# Patient Record
Sex: Male | Born: 1975 | Race: White | Hispanic: No | Marital: Single | Smoking: Never smoker
Health system: Southern US, Community
[De-identification: ages and names within clinical notes are randomized; demographics above are authoritative.]

## PROBLEM LIST (undated history)

## (undated) HISTORY — PX: HERNIA REPAIR: SHX51

---

## 2019-03-27 ENCOUNTER — Other Ambulatory Visit: Payer: Self-pay

## 2019-03-27 ENCOUNTER — Ambulatory Visit
Admission: EM | Admit: 2019-03-27 | Discharge: 2019-03-27 | Disposition: A | Discharge status: Home / Self Care | Payer: 59 | Attending: Family Medicine | Admitting: Family Medicine

## 2019-03-27 ENCOUNTER — Encounter: Payer: Self-pay | Admitting: Emergency Medicine

## 2019-03-27 DIAGNOSIS — R1030 Lower abdominal pain, unspecified: Secondary | ICD-10-CM | POA: Diagnosis not present

## 2019-03-27 DIAGNOSIS — K921 Melena: Secondary | ICD-10-CM | POA: Insufficient documentation

## 2019-03-27 DIAGNOSIS — R7989 Other specified abnormal findings of blood chemistry: Secondary | ICD-10-CM | POA: Insufficient documentation

## 2019-03-27 LAB — CBC WITH DIFFERENTIAL/PLATELET
Abs Immature Granulocytes: 0.03 10*3/uL (ref 0.00–0.07)
Basophils Absolute: 0.1 10*3/uL (ref 0.0–0.1)
Basophils Relative: 1 %
Eosinophils Absolute: 0.4 10*3/uL (ref 0.0–0.5)
Eosinophils Relative: 6 %
HCT: 45.5 % (ref 39.0–52.0)
Hemoglobin: 15.6 g/dL (ref 13.0–17.0)
Immature Granulocytes: 1 %
Lymphocytes Relative: 23 %
Lymphs Abs: 1.5 10*3/uL (ref 0.7–4.0)
MCH: 30.8 pg (ref 26.0–34.0)
MCHC: 34.3 g/dL (ref 30.0–36.0)
MCV: 89.7 fL (ref 80.0–100.0)
Monocytes Absolute: 0.5 10*3/uL (ref 0.1–1.0)
Monocytes Relative: 8 %
Neutro Abs: 4.1 10*3/uL (ref 1.7–7.7)
Neutrophils Relative %: 61 %
Platelets: 219 10*3/uL (ref 150–400)
RBC: 5.07 MIL/uL (ref 4.22–5.81)
RDW: 12.6 % (ref 11.5–15.5)
WBC: 6.6 10*3/uL (ref 4.0–10.5)
nRBC: 0 % (ref 0.0–0.2)

## 2019-03-27 LAB — COMPREHENSIVE METABOLIC PANEL
ALT: 161 U/L — ABNORMAL HIGH (ref 0–44)
AST: 103 U/L — ABNORMAL HIGH (ref 15–41)
Albumin: 4.6 g/dL (ref 3.5–5.0)
Alkaline Phosphatase: 60 U/L (ref 38–126)
Anion gap: 8 (ref 5–15)
BUN: 19 mg/dL (ref 6–20)
CO2: 27 mmol/L (ref 22–32)
Calcium: 8.7 mg/dL — ABNORMAL LOW (ref 8.9–10.3)
Chloride: 101 mmol/L (ref 98–111)
Creatinine, Ser: 1.13 mg/dL (ref 0.61–1.24)
GFR calc Af Amer: >60 mL/min (ref >60)
GFR calc non Af Amer: >60 mL/min (ref >60)
Glucose, Bld: 95 mg/dL (ref 70–99)
Potassium: 4.8 mmol/L (ref 3.5–5.1)
Sodium: 136 mmol/L (ref 135–145)
Total Bilirubin: 1.1 mg/dL (ref 0.3–1.2)
Total Protein: 7.7 g/dL (ref 6.5–8.1)

## 2019-03-27 LAB — LIPASE, BLOOD: Lipase: 39 U/L (ref 11–51)

## 2019-03-27 NOTE — Discharge Instructions (Signed)
No alcohol.  I am referring you to GI for evaluation and likely colonoscopy.  Take care  Dr. Lacinda Axon

## 2019-03-27 NOTE — ED Triage Notes (Signed)
Pt c/o bloody stools and lower abdominal pain. He states that the bloody stools have been going on for about a year but has gotten worse. He states it is dark red in color. He states the abdominal pain started about a month ago.

## 2019-03-27 NOTE — ED Provider Notes (Signed)
[facility]    CSN: [id number] Arrival date & time: 03/27/19  0813  History   Chief Complaint Chief Complaint  Patient presents with  . Abdominal Pain  . Rectal Bleeding   HPI  43 year old male presents with the above complaints.  Patient reports that over the past year he has had frequent/daily bloody bowel movements.  Patient reports that for the past month he has had associated lower abdominal pain which he describes as cramping.  He rates his pain as 5/10 in severity.  Denies melena.  Denies constipation.  Patient does state that he has a lot of gas.  Denies weight loss, fevers, chills.  He is a non-smoker.  He drinks alcohol on the weekends.  No family history of inflammatory bowel disease per his report.  No known exacerbating relieving factors.  No other reported symptoms.  No other complaints.  PMH, Surgical Hx, Family Hx, Social History reviewed and updated as below.  No significant PMH.  Past Surgical History:  Procedure Laterality Date  . HERNIA REPAIR     Home Medications    Prior to Admission medications   Not on File    Family History Family History  Problem Relation Age of Onset  . Healthy Mother   . Healthy Father   . Cancer Maternal Uncle        colon    Social History Social History   Tobacco Use  . Smoking status: Never Smoker  . Smokeless tobacco: Never Used  Substance Use Topics  . Alcohol use: Yes  . Drug use: Not Currently    Allergies   Patient has no known allergies.   Review of Systems Review of Systems  Constitutional: Negative for appetite change, chills, fever and unexpected weight change.  Gastrointestinal: Positive for abdominal pain and blood in stool.  All other systems reviewed and are negative.  Physical Exam Triage Vital Signs ED Triage Vitals  Enc Vitals Group     BP 03/27/19 0839 130/80     Pulse Rate 03/27/19 0839 69     Resp 03/27/19 0839 18     Temp 03/27/19 0839 98.6 F (37 C)     Temp  Source 03/27/19 0839 Oral     SpO2 03/27/19 0839 98 %     Weight 03/27/19 0835 205 lb (93 kg)     Height 03/27/19 0835 6' (1.829 m)     Head Circumference --      Peak Flow --      Pain Score 03/27/19 0835 5     Pain Loc --      Pain Edu? --      Excl. in [place]? --    Updated Vital Signs BP 130/80 (BP Location: Right Arm)   Pulse 69   Temp 98.6 F (37 C) (Oral)   Resp 18   Ht 6' (1.829 m)   Wt 93 kg   SpO2 98%   BMI 27.80 kg/m   Visual Acuity Right Eye Distance:   Left Eye Distance:   Bilateral Distance:    Right Eye Near:   Left Eye Near:    Bilateral Near:     Physical Exam Vitals and nursing note reviewed.  Constitutional:      General: He is not in acute distress.    Appearance: Normal appearance. He is not ill-appearing.  HENT:     Head: Normocephalic and atraumatic.  Eyes:     General:        Right eye:  No discharge.        Left eye: No discharge.     Conjunctiva/sclera: Conjunctivae normal.  Cardiovascular:     Rate and Rhythm: Normal rate and regular rhythm.     Heart sounds: No murmur.  Pulmonary:     Effort: Pulmonary effort is normal.     Breath sounds: No wheezing, rhonchi or rales.  Abdominal:     General: There is no distension.     Palpations: Abdomen is soft.     Tenderness: There is no abdominal tenderness. There is no guarding or rebound.  Skin:    General: Skin is warm.     Findings: No rash.  Neurological:     Mental Status: He is alert.  Psychiatric:        Mood and Affect: Mood normal.        Behavior: Behavior normal.    UC Treatments / Results  Labs (all labs ordered are listed, but only abnormal results are displayed) Labs Reviewed  COMPREHENSIVE METABOLIC PANEL - Abnormal; Notable for the following components:      Result Value   Calcium 8.7 (*)    AST 103 (*)    ALT 161 (*)    All other components within normal limits  CBC WITH DIFFERENTIAL/PLATELET  LIPASE, BLOOD    EKG   Radiology No results  found.  Procedures Procedures (including critical care time)  Medications Ordered in UC Medications - No data to display  Initial Impression / Assessment and Plan / UC Course  I have reviewed the triage vital signs and the nursing notes.  Pertinent labs & imaging results that were available during my care of the patient were reviewed by me and considered in my medical decision making (see chart for details).    43 year old male presents with a 1 year history of regular/frequent hematochezia. Now having abdominal pain.  CBC normal.  CMP revealed elevated AST and ALT of 103 and 161 respectively.  Labs otherwise unremarkable.  Concern for inflammatory bowel disease.  Referring to GI for further evaluation and colonoscopy.  Final Clinical Impressions(s) / UC Diagnoses   Final diagnoses:  Hematochezia  Lower abdominal pain  Elevated LFTs     Discharge Instructions     No alcohol.  I am referring you to GI for evaluation and likely colonoscopy.  Take care  Dr. Adriana Simas     ED Prescriptions    None     PDMP not reviewed this encounter.   Everlene Other [place], [place] 03/27/19 [phone]

## 2019-05-13 ENCOUNTER — Ambulatory Visit: Payer: 59 | Admitting: Gastroenterology

## 2019-06-23 ENCOUNTER — Ambulatory Visit: Admission: EM | Admit: 2019-06-23 | Discharge: 2019-06-23 | Discharge status: Against Medical Advice | Payer: 59

## 2019-06-23 ENCOUNTER — Other Ambulatory Visit: Payer: Self-pay

## 2019-10-14 ENCOUNTER — Encounter: Payer: Self-pay | Admitting: Emergency Medicine

## 2019-10-14 ENCOUNTER — Ambulatory Visit
Admission: EM | Admit: 2019-10-14 | Discharge: 2019-10-14 | Disposition: A | Discharge status: Home / Self Care | Payer: 59 | Attending: Family Medicine | Admitting: Family Medicine

## 2019-10-14 ENCOUNTER — Other Ambulatory Visit: Payer: Self-pay

## 2019-10-14 DIAGNOSIS — M79642 Pain in left hand: Secondary | ICD-10-CM

## 2019-10-14 MED ORDER — MELOXICAM 15 MG PO TABS: 15.0000 mg | ORAL_TABLET | Freq: Every day | ORAL | 0 refills | Status: AC | PRN

## 2019-10-14 NOTE — ED Triage Notes (Signed)
Pt c/o left hand pain. Started about 3 weeks ago. No known injury. He states it is getting harder to grip objects and the middle finger joint seems to be swollen. He states he has taken ibuprofen and helps.

## 2019-10-14 NOTE — ED Provider Notes (Signed)
[facility]    CSN: [id number] Arrival date & time: 10/14/19  1258      History   Chief Complaint Chief Complaint  Patient presents with   Hand Pain    left   HPI  44 year old male presents with left hand pain.  3-week history of left hand pain.  Pain is predominantly around the PIP joint and MCP joint of the left middle finger.  No fall, trauma, injury.  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  The area appears to be swollen.  He has been taking ibuprofen with some improvement.  However, the problem has not resolved.  No other associated symptoms.  No other complaints.  Home Medications    Prior to Admission medications   Medication Sig Start Date End Date Taking? Authorizing Provider  meloxicam (MOBIC) 15 MG tablet Take 1 tablet (15 mg total) by mouth daily as needed for pain. 10/14/19   Tommie Sams, DO    Family History Family History  Problem Relation Age of Onset   Healthy Mother    Healthy Father    Cancer Maternal Uncle        colon   Social History Social History   Tobacco Use   Smoking status: Never Smoker   Smokeless tobacco: Current User    Types: Snuff  Vaping Use   Vaping Use: Never used  Substance Use Topics   Alcohol use: Yes   Drug use: Not Currently   Allergies   Sulfamethoxazole-trimethoprim   Review of Systems Review of Systems  Musculoskeletal:       Hand pain.   Physical Exam Triage Vital Signs ED Triage Vitals  Enc Vitals Group     BP 10/14/19 1312 (!) 134/94     Pulse Rate 10/14/19 1312 70     Resp 10/14/19 1312 18     Temp 10/14/19 1312 98.2 F (36.8 C)     Temp Source 10/14/19 1312 Oral     SpO2 10/14/19 1312 99 %     Weight 10/14/19 1309 205 lb 0.4 oz (93 kg)     Height 10/14/19 1309 6' (1.829 m)     Head Circumference --      Peak Flow --      Pain Score 10/14/19 1309 6     Pain Loc --      Pain Edu? --      Excl. in [place]? --    Updated Vital  Signs BP (!) 134/94 (BP Location: Left Arm)    Pulse 70    Temp 98.2 F (36.8 C) (Oral)    Resp 18    Ht 6' (1.829 m)    Wt 93 kg    SpO2 99%    BMI 27.81 kg/m   Visual Acuity Right Eye Distance:   Left Eye Distance:   Bilateral Distance:    Right Eye Near:   Left Eye Near:    Bilateral Near:     Physical Exam Vitals and nursing note reviewed.  Constitutional:      General: He is not in acute distress.    Appearance: Normal appearance. He is not ill-appearing.  HENT:     Head: Normocephalic and atraumatic.  Eyes:     General:        Right eye: No discharge.        Left eye: No discharge.     Conjunctiva/sclera: Conjunctivae normal.  Pulmonary:  Effort: Pulmonary effort is normal. No respiratory distress.  Musculoskeletal:     Comments: Patient with mild swelling at the MCP joint of the left third digit.  Tender to palpation.  Neurological:     Mental Status: He is alert.  Psychiatric:        Mood and Affect: Mood normal.        Behavior: Behavior normal.    UC Treatments / Results  Labs (all labs ordered are listed, but only abnormal results are displayed) Labs Reviewed - No data to display  EKG   Radiology No results found.  Procedures Procedures (including critical care time)  Medications Ordered in UC Medications - No data to display  Initial Impression / Assessment and Plan / UC Course  I have reviewed the triage vital signs and the nursing notes.  Pertinent labs & imaging results that were available during my care of the patient were reviewed by me and considered in my medical decision making (see chart for details).    44 year old male presents with atraumatic left hand pain.  Advise rest and ice.  Meloxicam as directed.  Supportive care.  No indication for imaging at this time.  Final Clinical Impressions(s) / UC Diagnoses   Final diagnoses:  Left hand pain     Discharge Instructions     Ice the area.  Medication as directed.  Take  care  Dr. Adriana Simas    ED Prescriptions    Medication Sig Dispense Auth. Provider   meloxicam (MOBIC) 15 MG tablet Take 1 tablet (15 mg total) by mouth daily as needed for pain. 30 tablet Tommie Sams, DO     PDMP not reviewed this encounter.   Tommie Sams, [place] 10/14/19 1523

## 2019-10-14 NOTE — Discharge Instructions (Signed)
Ice the area.  Medication as directed.  Take care  Dr. Adriana Simas

## 2019-12-15 ENCOUNTER — Other Ambulatory Visit: Payer: Self-pay

## 2019-12-15 ENCOUNTER — Encounter: Payer: Self-pay | Admitting: Emergency Medicine

## 2019-12-15 ENCOUNTER — Ambulatory Visit
Admission: EM | Admit: 2019-12-15 | Discharge: 2019-12-15 | Disposition: A | Discharge status: Home / Self Care | Payer: HRSA Program | Attending: Emergency Medicine | Admitting: Emergency Medicine

## 2019-12-15 DIAGNOSIS — R5383 Other fatigue: Secondary | ICD-10-CM

## 2019-12-15 DIAGNOSIS — R112 Nausea with vomiting, unspecified: Secondary | ICD-10-CM

## 2019-12-15 DIAGNOSIS — Z881 Allergy status to other antibiotic agents status: Secondary | ICD-10-CM | POA: Insufficient documentation

## 2019-12-15 DIAGNOSIS — U071 COVID-19: Secondary | ICD-10-CM | POA: Insufficient documentation

## 2019-12-15 MED ORDER — ONDANSETRON 4 MG PO TBDP: 4.0000 mg | ORAL_TABLET | Freq: Four times a day (QID) | ORAL | 0 refills | Status: AC | PRN

## 2019-12-15 NOTE — ED Provider Notes (Signed)
[facility]    CSN: [id number] Arrival date & time: 12/15/19  1204      History   Chief Complaint Chief Complaint  Patient presents with  . Emesis  . Fatigue    HPI Thomas Cohen is a 44 y.o. male.   Patient presenting for onset of vomiting, fatigue and chills that started yesterday.  He denies any associated fever, abdominal pain, diarrhea, dysuria.  He denies any upper respiratory symptoms including cough, sore throat, congestion.  He denies any known Covid exposure, but says he would like a Covid test make sure he is okay to return to work.  He has not been Covid vaccinated.  He says he is just been try to increase his fluid intake and rest.  He says he was having about 10 episodes of vomiting a day, but that has been reduced.  He has been trying to eat and has been able to hold some food down but continues to feel nauseous.  There are no other concerns today.  HPI  History reviewed. No pertinent past medical history.  There are no problems to display for this patient.   Past Surgical History:  Procedure Laterality Date  . HERNIA REPAIR         Home Medications    Prior to Admission medications   Medication Sig Start Date End Date Taking? Authorizing Provider  meloxicam (MOBIC) 15 MG tablet Take 1 tablet (15 mg total) by mouth daily as needed for pain. 10/14/19   Tommie Sams, DO  ondansetron (ZOFRAN ODT) 4 MG disintegrating tablet Take 1 tablet (4 mg total) by mouth every 6 (six) hours as needed for up to 5 days for nausea or vomiting. 12/15/19 12/20/19  Shirlee Latch, PA-C    Family History Family History  Problem Relation Age of Onset  . Healthy Mother   . Healthy Father   . Cancer Maternal Uncle        colon    Social History Social History   Tobacco Use  . Smoking status: Never Smoker  . Smokeless tobacco: Current User    Types: Snuff  Vaping Use  . Vaping Use: Never used  Substance Use Topics  . Alcohol use: Yes  . Drug use: Not  Currently     Allergies   Sulfamethoxazole-trimethoprim   Review of Systems Review of Systems  Constitutional: Positive for chills and fatigue. Negative for fever.  HENT: Negative for congestion, rhinorrhea, sinus pressure, sinus pain and sore throat.   Respiratory: Negative for cough and shortness of breath.   Gastrointestinal: Positive for nausea and vomiting. Negative for abdominal pain and diarrhea.  Musculoskeletal: Negative for myalgias.  Neurological: Negative for weakness, light-headedness and headaches.  Hematological: Negative for adenopathy.     Physical Exam Triage Vital Signs ED Triage Vitals  Enc Vitals Group     BP 12/15/19 1218 116/80     Pulse Rate 12/15/19 1218 70     Resp 12/15/19 1218 16     Temp 12/15/19 1218 99.1 F (37.3 C)     Temp Source 12/15/19 1218 Oral     SpO2 12/15/19 1218 98 %     Weight 12/15/19 1215 200 lb (90.7 kg)     Height 12/15/19 1215 5\' 11"  (1.803 m)     Head Circumference --      Peak Flow --      Pain Score 12/15/19 1215 0     Pain Loc --  Pain Edu? --      Excl. in [place]? --    No data found.  Updated Vital Signs BP 116/80 (BP Location: Right Arm)   Pulse 70   Temp 99.1 F (37.3 C) (Oral)   Resp 16   Ht 5\' 11"  (1.803 m)   Wt 200 lb (90.7 kg)   SpO2 98%   BMI 27.89 kg/m       Physical Exam Vitals and nursing note reviewed.  Constitutional:      General: He is not in acute distress.    Appearance: Normal appearance. He is well-developed. He is not ill-appearing or toxic-appearing.  HENT:     Head: Normocephalic and atraumatic.     Nose: Nose normal. No congestion.     Mouth/Throat:     Mouth: Mucous membranes are moist.     Pharynx: Oropharynx is clear.  Eyes:     General: No scleral icterus.    Conjunctiva/sclera: Conjunctivae normal.  Cardiovascular:     Rate and Rhythm: Normal rate and regular rhythm.     Heart sounds: Normal heart sounds. No murmur heard.   Pulmonary:     Effort: Pulmonary effort  is normal. No respiratory distress.     Breath sounds: Normal breath sounds. No wheezing, rhonchi or rales.  Abdominal:     Palpations: Abdomen is soft.     Tenderness: There is no abdominal tenderness.  Musculoskeletal:     Cervical back: Neck supple.  Skin:    General: Skin is warm and dry.  Neurological:     General: No focal deficit present.     Mental Status: He is alert. Mental status is at baseline.     Motor: No weakness.     Gait: Gait normal.  Psychiatric:        Mood and Affect: Mood normal.        Behavior: Behavior normal.        Thought Content: Thought content normal.      UC Treatments / Results  Labs (all labs ordered are listed, but only abnormal results are displayed) Labs Reviewed  SARS CORONAVIRUS 2 (TAT 6-24 HRS)    EKG   Radiology No results found.  Procedures Procedures (including critical care time)  Medications Ordered in UC Medications - No data to display  Initial Impression / Assessment and Plan / UC Course  I have reviewed the triage vital signs and the nursing notes.  Pertinent labs & imaging results that were available during my care of the patient were reviewed by me and considered in my medical decision making (see chart for details).   Patient presenting for nausea, vomiting, chills and fatigue starting yesterday.  All vital signs are stable and normal exam.  Suspect possible viral illness.  Advised him to rest and increase fluid intake.  Sent Zofran to pharmacy.  Covid testing obtained.  Discussed CDC guidelines, isolation protocol and ED precautions with patient.  Advised him to return for any new or worsening symptoms including abdominal pain or fever.  Final Clinical Impressions(s) / UC Diagnoses   Final diagnoses:  Non-intractable vomiting with nausea, unspecified vomiting type  Fatigue, unspecified type     Discharge Instructions     You have received COVID testing today either for positive exposure, concerning  symptoms that could be related to COVID infection, screening purposes, or re-testing after confirmed positive.  Your test obtained today checks for active viral infection in the last 1-2 weeks. If your test is negative  now, you can still test positive later. So, if you do develop symptoms you should either get re-tested and/or isolate x 10 days. Please follow CDC guidelines.  While Rapid antigen tests come back in 15-20 minutes, send out PCR/molecular test results typically come back within 24 hours. In the mean time, if you are symptomatic, assume this could be a positive test and treat/monitor yourself as if you do have COVID.   We will call with test results. Please download the MyChart app and set up a profile to access test results.   If symptomatic, go home and rest. Push fluids. Take Tylenol as needed for discomfort. Gargle warm salt water. Throat lozenges. Take Mucinex DM or Robitussin for cough. Humidifier in bedroom to ease coughing. Warm showers. Also review the COVID handout for more information.  COVID-19 INFECTION: The incubation period of COVID-19 is approximately 14 days after exposure, with most symptoms developing in roughly 4-5 days. Symptoms may range in severity from mild to critically severe. Roughly 80% of those infected will have mild symptoms. People of any age may become infected with COVID-19 and have the ability to transmit the virus. The most common symptoms include: fever, fatigue, cough, body aches, headaches, sore throat, nasal congestion, shortness of breath, nausea, vomiting, diarrhea, changes in smell and/or taste.    COURSE OF ILLNESS Some patients may begin with mild disease which can progress quickly into critical symptoms. If your symptoms are worsening please call ahead to the Emergency Department and proceed there for further treatment. Recovery time appears to be roughly 1-2 weeks for mild symptoms and 3-6 weeks for severe disease.   GO IMMEDIATELY TO ER FOR  FEVER YOU ARE UNABLE TO GET DOWN WITH TYLENOL, BREATHING PROBLEMS, CHEST PAIN, FATIGUE, LETHARGY, INABILITY TO EAT OR DRINK, ETC  QUARANTINE AND ISOLATION: To help decrease the spread of COVID-19 please remain isolated if you have COVID infection or are highly suspected to have COVID infection. This means -stay home and isolate to one room in the home if you live with others. Do not share a bed or bathroom with others while ill, sanitize and wipe down all countertops and keep common areas clean and disinfected. You may discontinue isolation if you have a mild case and are asymptomatic 10 days after symptom onset as long as you have been fever free >24 hours without having to take Motrin or Tylenol. If your case is more severe (meaning you develop pneumonia or are admitted in the hospital), you may have to isolate longer.   If you have been in close contact (within 6 feet) of someone diagnosed with COVID 19, you are advised to quarantine in your home for 14 days as symptoms can develop anywhere from 2-14 days after exposure to the virus. If you develop symptoms, you  must isolate.  Most current guidelines for COVID after exposure -isolate 10 days if you ARE NOT tested for COVID as long as symptoms do not develop -isolate 7 days if you are tested and remain asymptomatic -You do not necessarily need to be tested for COVID if you have + exposure and        develop   symptoms. Just isolate at home x10 days from symptom onset During this global pandemic, CDC advises to practice social distancing, try to stay at least 196ft away from others at all times. Wear a face covering. Wash and sanitize your hands regularly and avoid going anywhere that is not necessary.  KEEP IN MIND THAT THE COVID  TEST IS NOT 100% ACCURATE AND YOU SHOULD STILL DO EVERYTHING TO PREVENT POTENTIAL SPREAD OF VIRUS TO OTHERS (WEAR MASK, WEAR GLOVES, WASH HANDS AND SANITIZE REGULARLY). IF INITIAL TEST IS NEGATIVE, THIS MAY NOT MEAN YOU ARE  DEFINITELY NEGATIVE. MOST ACCURATE TESTING IS DONE 5-7 DAYS AFTER EXPOSURE.   It is not advised by CDC to get re-tested after receiving a positive COVID test since you can still test positive for weeks to months after you have already cleared the virus.   *If you have not been vaccinated for COVID, I strongly suggest you consider getting vaccinated as long as there are no contraindications.      ED Prescriptions    Medication Sig Dispense Auth. Provider   ondansetron (ZOFRAN ODT) 4 MG disintegrating tablet Take 1 tablet (4 mg total) by mouth every 6 (six) hours as needed for up to 5 days for nausea or vomiting. 15 tablet Gareth Morgan     PDMP not reviewed this encounter.   Shirlee Latch, PA-C 12/15/19 1249

## 2019-12-15 NOTE — Discharge Instructions (Addendum)

## 2019-12-15 NOTE — ED Triage Notes (Signed)
Patient c/o vomiting that started Saturday.  Patient also reports fatigue and chills.

## 2019-12-16 LAB — SARS CORONAVIRUS 2 (TAT 6-24 HRS): SARS Coronavirus 2: POSITIVE — AB

## 2019-12-17 ENCOUNTER — Telehealth (HOSPITAL_COMMUNITY): Payer: Self-pay | Admitting: Family

## 2019-12-17 DIAGNOSIS — U071 COVID-19: Secondary | ICD-10-CM

## 2019-12-17 NOTE — Telephone Encounter (Signed)
Called to discuss with [facility] about Covid symptoms and potential candidacy for the use of casirivimab/imdevimab, a combination monoclonal antibody infusion for those with mild to moderate Covid symptoms and at a high risk of hospitalization.     Pt is qualified for this infusion at the infusion center due to co-morbid conditions and/or a member of an at-risk group, however unable to reach patient. VM left.   Symptoms of vomiting and fatigue began 9/18 per EMR. BMI > 25.   Joie Hipps,NP

## 2019-12-26 ENCOUNTER — Ambulatory Visit (INDEPENDENT_AMBULATORY_CARE_PROVIDER_SITE_OTHER): Payer: Private Health Insurance - Indemnity

## 2019-12-26 ENCOUNTER — Ambulatory Visit
Admission: EM | Admit: 2019-12-26 | Discharge: 2019-12-26 | Disposition: A | Discharge status: Home / Self Care | Payer: Private Health Insurance - Indemnity | Attending: Internal Medicine | Admitting: Internal Medicine

## 2019-12-26 ENCOUNTER — Other Ambulatory Visit: Payer: Self-pay

## 2019-12-26 DIAGNOSIS — Z8616 Personal history of COVID-19: Secondary | ICD-10-CM

## 2019-12-26 DIAGNOSIS — J40 Bronchitis, not specified as acute or chronic: Secondary | ICD-10-CM | POA: Diagnosis not present

## 2019-12-26 DIAGNOSIS — R05 Cough: Secondary | ICD-10-CM

## 2019-12-26 DIAGNOSIS — R7989 Other specified abnormal findings of blood chemistry: Secondary | ICD-10-CM | POA: Diagnosis present

## 2019-12-26 LAB — COMPREHENSIVE METABOLIC PANEL
ALT: 148 U/L — ABNORMAL HIGH (ref 0–44)
AST: 96 U/L — ABNORMAL HIGH (ref 15–41)
Albumin: 4.4 g/dL (ref 3.5–5.0)
Alkaline Phosphatase: 79 U/L (ref 38–126)
Anion gap: 9 (ref 5–15)
BUN: 16 mg/dL (ref 6–20)
CO2: 27 mmol/L (ref 22–32)
Calcium: 9.2 mg/dL (ref 8.9–10.3)
Chloride: 100 mmol/L (ref 98–111)
Creatinine, Ser: 0.79 mg/dL (ref 0.61–1.24)
GFR calc Af Amer: >60 mL/min (ref >60)
GFR calc non Af Amer: >60 mL/min (ref >60)
Glucose, Bld: 98 mg/dL (ref 70–99)
Potassium: 4.4 mmol/L (ref 3.5–5.1)
Sodium: 136 mmol/L (ref 135–145)
Total Bilirubin: 0.8 mg/dL (ref 0.3–1.2)
Total Protein: 7.9 g/dL (ref 6.5–8.1)

## 2019-12-26 LAB — CBC WITH DIFFERENTIAL/PLATELET
Abs Immature Granulocytes: 0.01 10*3/uL (ref 0.00–0.07)
Basophils Absolute: 0 10*3/uL (ref 0.0–0.1)
Basophils Relative: 0 %
Eosinophils Absolute: 0.1 10*3/uL (ref 0.0–0.5)
Eosinophils Relative: 1 %
HCT: 45.5 % (ref 39.0–52.0)
Hemoglobin: 15.9 g/dL (ref 13.0–17.0)
Immature Granulocytes: 0 %
Lymphocytes Relative: 27 %
Lymphs Abs: 1.4 10*3/uL (ref 0.7–4.0)
MCH: 31.5 pg (ref 26.0–34.0)
MCHC: 34.9 g/dL (ref 30.0–36.0)
MCV: 90.1 fL (ref 80.0–100.0)
Monocytes Absolute: 0.6 10*3/uL (ref 0.1–1.0)
Monocytes Relative: 11 %
Neutro Abs: 3.2 10*3/uL (ref 1.7–7.7)
Neutrophils Relative %: 61 %
Platelets: 198 10*3/uL (ref 150–400)
RBC: 5.05 MIL/uL (ref 4.22–5.81)
RDW: 11.9 % (ref 11.5–15.5)
WBC: 5.3 10*3/uL (ref 4.0–10.5)
nRBC: 0 % (ref 0.0–0.2)

## 2019-12-26 MED ORDER — ALBUTEROL SULFATE HFA 108 (90 BASE) MCG/ACT IN AERS: 2.0000 | INHALATION_SPRAY | RESPIRATORY_TRACT | 0 refills | Status: AC | PRN

## 2019-12-26 MED ORDER — CLARITHROMYCIN 500 MG PO TABS: 500.0000 mg | ORAL_TABLET | Freq: Two times a day (BID) | ORAL | 0 refills | Status: AC

## 2019-12-26 NOTE — ED Provider Notes (Signed)
[facility]    CSN: [id number] Arrival date & time: 12/26/19  6967      History   Chief Complaint Chief Complaint  Patient presents with  . Cough    HPI Thomas Cohen is a 44 y.o. male who presents with productive cough with green sputum and wheezing x 5 days. His cough had been non productive. Had onset of covid symptoms 2 weeks ago and his test was positive on 9/20. Has not ran a fever in the past week,but has continued having night sweats. Also his appetite is down, and he is making himself drink ensure and is hydrating with Pedialyte. Has been fatigued.   Has had pneumonia ones in the past.  Denies drinking any alcohol  History reviewed. No pertinent past medical history.  There are no problems to display for this patient.   Past Surgical History:  Procedure Laterality Date  . HERNIA REPAIR       Home Medications    Prior to Admission medications   Medication Sig Start Date End Date Taking? Authorizing Provider  meloxicam (MOBIC) 15 MG tablet Take 1 tablet (15 mg total) by mouth daily as needed for pain. 10/14/19   Tommie Sams, DO    Family History Family History  Problem Relation Age of Onset  . Healthy Mother   . Healthy Father   . Cancer Maternal Uncle        colon    Social History Social History   Tobacco Use  . Smoking status: Never Smoker  . Smokeless tobacco: Current User    Types: Snuff  Vaping Use  . Vaping Use: Never used  Substance Use Topics  . Alcohol use: Yes  . Drug use: Not Currently     Allergies   Sulfamethoxazole-trimethoprim   Review of Systems Review of Systems  Constitutional: Positive for appetite change, diaphoresis and fatigue. Negative for chills and fever.  HENT: Negative for congestion, ear discharge, ear pain, postnasal drip, rhinorrhea and tinnitus.   Eyes: Negative for discharge.  Respiratory: Positive for cough and wheezing. Negative for chest tightness and shortness of breath.     Cardiovascular: Negative for chest pain.  Gastrointestinal: Negative for diarrhea, nausea and vomiting.       Has had elevated LFT's 02/2019 and stopped drinking due to this. Hepatitis panel was negative, but has not had [place] or CT for further work up   Musculoskeletal: Negative for myalgias.  Skin: Negative for rash.  Neurological: Negative for dizziness, weakness and headaches.  Hematological: Negative for adenopathy.     Physical Exam Triage Vital Signs ED Triage Vitals  Enc Vitals Group     BP 12/26/19 0847 (!) 135/91     Pulse Rate 12/26/19 0847 91     Resp 12/26/19 0847 18     Temp 12/26/19 0847 98.5 F (36.9 C)     Temp Source 12/26/19 0847 Oral     SpO2 12/26/19 0847 100 %     Weight 12/26/19 0848 199 lb 15.3 oz (90.7 kg)     Height 12/26/19 0848 5\' 11"  (1.803 m)     Head Circumference --      Peak Flow --      Pain Score 12/26/19 0847 7     Pain Loc --      Pain Edu? --      Excl. in GC? --    No data found.  Updated Vital Signs BP (!) 135/91   Pulse 91   Temp  98.5 F (36.9 C) (Oral)   Resp 18   Ht 5\' 11"  (1.803 m)   Wt 199 lb 15.3 oz (90.7 kg)   SpO2 100%   BMI 27.89 kg/m   Visual Acuity Right Eye Distance:   Left Eye Distance:   Bilateral Distance:    Right Eye Near:   Left Eye Near:    Bilateral Near:     Physical Exam Constitutional:      General: He is not in acute distress.    Appearance: He is not toxic-appearing.  HENT:     Head: Normocephalic.     Right Ear: Tympanic membrane, ear canal and external ear normal.     Left Ear: Ear canal and external ear normal.     Nose: Nose normal.     Mouth/Throat:     Mouth: Mucous membranes are moist.     Pharynx: Oropharynx is clear.  Eyes:     General: No scleral icterus.    Conjunctiva/sclera: Conjunctivae normal.  Cardiovascular:     Rate and Rhythm: Normal rate and regular rhythm.     Heart sounds: No murmur heard.   Pulmonary:     Effort: Pulmonary effort is normal. No respiratory  distress.     Breath sounds: Wheezing present.     Comments: Has auditory wheezing Musculoskeletal:        General: Normal range of motion.     Cervical back: Neck supple.  Lymphadenopathy:     Cervical: No cervical adenopathy.  Skin:    General: Skin is warm and dry.     Findings: No rash.  Neurological:     Mental Status: He is alert and oriented to person, place, and time.     Gait: Gait normal.  Psychiatric:        Mood and Affect: Mood normal.        Behavior: Behavior normal.        Thought Content: Thought content normal.        Judgment: Judgment normal.    UC Treatments / Results  Labs (all labs ordered are listed, but only abnormal results are displayed) Labs Reviewed - No data to display  EKG   Radiology No results found.  Procedures Procedures (including critical care time)  Medications Ordered in UC Medications - No data to display  Initial Impression / Assessment and Plan / UC Course  I have reviewed the triage vital signs and the nursing notes. Pertinent labs & imaging results that were available during my care of the patient were reviewed by me and considered in my medical decision making (see chart for details). Has bronchitis and elevated LFT's which I compared them with his prior one and has decreased a little since then. I placed him on Biaxen and Albuterol inhaler.  I researched what antibiotics I could place him with his LFTs being elevated and this one states in epocrates contraindications and cautions, to not give if clarithromycin-associated heptic impairment hx. ( high LFTs have been from increased consumption of ETOH) See instructions.   Final Clinical Impressions(s) / UC Diagnoses   Final diagnoses:  None   Discharge Instructions   None    ED Prescriptions    None     PDMP not reviewed this encounter.   , Garey Ham 12/26/19 [phone]

## 2019-12-26 NOTE — ED Triage Notes (Signed)
Tested + for covid 2 weeks. sts he's having worsening productive cough, generalized weakness and loss of appetite. "its hard to explain what is going on with me, but something isn't right".

## 2019-12-26 NOTE — Discharge Instructions (Addendum)
Your chest xray does not show pneumonia Your cell count is normal. Your chemistry test is normal, except your liver shows some damage since your liver enzymes are elevated. Do not drink alcohol or take Tylenol. It needs to be rechecked in 2-3 weeks with your family doctor.  Finish the antibiotic til gone, you should be improving in 48-72h. But if you get worse, you need to be seen again.

## 2020-05-26 ENCOUNTER — Ambulatory Visit
Admission: EM | Admit: 2020-05-26 | Discharge: 2020-05-26 | Disposition: A | Discharge status: Home / Self Care | Payer: No Typology Code available for payment source | Attending: Physician Assistant | Admitting: Physician Assistant

## 2020-05-26 ENCOUNTER — Other Ambulatory Visit: Payer: Self-pay

## 2020-05-26 DIAGNOSIS — M79602 Pain in left arm: Secondary | ICD-10-CM

## 2020-05-26 DIAGNOSIS — R200 Anesthesia of skin: Secondary | ICD-10-CM | POA: Diagnosis not present

## 2020-05-26 DIAGNOSIS — G5622 Lesion of ulnar nerve, left upper limb: Secondary | ICD-10-CM | POA: Diagnosis not present

## 2020-05-26 DIAGNOSIS — M79601 Pain in right arm: Secondary | ICD-10-CM | POA: Diagnosis not present

## 2020-05-26 DIAGNOSIS — R202 Paresthesia of skin: Secondary | ICD-10-CM

## 2020-05-26 MED ORDER — DICLOFENAC SODIUM 75 MG PO TBEC: 75.0000 mg | DELAYED_RELEASE_TABLET | Freq: Two times a day (BID) | ORAL | 0 refills | Status: AC

## 2020-05-26 NOTE — ED Triage Notes (Signed)
Patient presents to Urgent Care with complaints of bilateral intermittent arm pain and numbness x 2 years. Pt reports pain has been getting worse recently. He states he does not have pcp and not treating discomfort with any meds.

## 2020-05-26 NOTE — Discharge Instructions (Signed)
Your symptoms are most consistent with cubital tunnel syndrome or ulnar neuropathy.  See handout included.  Start the anti-inflammatory medicine I sent to the pharmacy.  You can also take Tylenol as needed for pain relief.  Try to avoid repetitive movements of the elbow.  You can ice the elbow and try to keep it elevated.  If no improvement in the next 2 weeks then he should follow-up with orthopedics for further work-up.  Or go to Ortho sooner if any symptoms worsen.

## 2020-05-26 NOTE — ED Provider Notes (Signed)
[facility]    CSN: [id number] Arrival date & time: 05/26/20  1106      History   Chief Complaint Chief Complaint  Patient presents with  . Arm Pain    HPI Thomas Cohen is a 45 y.o. male presenting for 2.5 year intermittent history of bilateral arm pain, numbness and tingling. Denies injury. Pain worse with reaching over head and worse pain at night. He says that the numbness, tingling and pain is really all the way up and down his arms but affects his hands primarily. He says it seems to affect all fingers. He says that typically it will come and go and last for couple months and then go away. He says that it has been pretty steady over the past 2 weeks and worse than normal. Admits to difficulty sleeping at night. He states that he wakes up due to the discomfort in the past 2 shake his hands and stand up in order to improve the discomfort, numbness and tingling. He denies any neck pain or difficulty moving neck or painful range of motion of the neck. He denies any shoulder pain. Patient has no history of diabetes or neuropathy. He has not been seen by PCP for this. Patient admits that he does not currently have a PCP. He says he has not taken any medication for the symptoms. He does take buprenorphine. He has no other concerns.  HPI  History reviewed. No pertinent past medical history.  There are no problems to display for this patient.   Past Surgical History:  Procedure Laterality Date  . HERNIA REPAIR         Home Medications    Prior to Admission medications   Medication Sig Start Date End Date Taking? Authorizing Provider  diclofenac (VOLTAREN) 75 MG EC tablet Take 1 tablet (75 mg total) by mouth 2 (two) times daily. 05/26/20 06/25/20 Yes Shirlee Latch, PA-C  albuterol (VENTOLIN HFA) 108 (90 Base) MCG/ACT inhaler Inhale 2 puffs into the lungs every 4 (four) hours as needed for wheezing or shortness of breath. For 7 days, then prn cough and wheezing 12/26/19    Rodriguez-Southworth, Nettie Elm, PA-C  clarithromycin (BIAXIN) 500 MG tablet Take 1 tablet (500 mg total) by mouth 2 (two) times daily. 12/26/19   Rodriguez-Southworth, Nettie Elm, PA-C  meloxicam (MOBIC) 15 MG tablet Take 1 tablet (15 mg total) by mouth daily as needed for pain. 10/14/19   Tommie Sams, DO    Family History Family History  Problem Relation Age of Onset  . Healthy Mother   . Healthy Father   . Cancer Maternal Uncle        colon    Social History Social History   Tobacco Use  . Smoking status: Never Smoker  . Smokeless tobacco: Current User    Types: Snuff  Vaping Use  . Vaping Use: Never used  Substance Use Topics  . Alcohol use: Yes  . Drug use: Not Currently     Allergies   Sulfamethoxazole-trimethoprim   Review of Systems Review of Systems  Constitutional: Negative for fatigue and fever.  Eyes: Negative for photophobia and visual disturbance.  Respiratory: Negative for cough and shortness of breath.   Cardiovascular: Negative for chest pain.  Musculoskeletal: Positive for arthralgias. Negative for back pain, joint swelling, neck pain and neck stiffness.  Skin: Negative for rash and wound.  Neurological: Positive for numbness. Negative for dizziness, weakness and headaches.     Physical Exam Triage Vital Signs ED  Triage Vitals  Enc Vitals Group     BP 05/26/20 1147 (!) 129/91     Pulse Rate 05/26/20 1147 60     Resp 05/26/20 1147 16     Temp 05/26/20 1147 98 F (36.7 C)     Temp Source 05/26/20 1147 Oral     SpO2 05/26/20 1147 99 %     Weight 05/26/20 1145 195 lb (88.5 kg)     Height --      Head Circumference --      Peak Flow --      Pain Score 05/26/20 1144 2     Pain Loc --      Pain Edu? --      Excl. in GC? --    No data found.  Updated Vital Signs BP (!) 129/91 (BP Location: Left Arm)   Pulse 60   Temp 98 F (36.7 C) (Oral)   Resp 16   Wt 195 lb (88.5 kg)   SpO2 99%   BMI 27.20 kg/m       Physical Exam Vitals and  nursing note reviewed.  Constitutional:      General: He is not in acute distress.    Appearance: Normal appearance. He is well-developed and well-nourished. He is not ill-appearing.  HENT:     Head: Normocephalic and atraumatic.  Eyes:     General: No scleral icterus.    Conjunctiva/sclera: Conjunctivae normal.     Pupils: Pupils are equal, round, and reactive to light.  Cardiovascular:     Rate and Rhythm: Normal rate and regular rhythm.     Pulses: Normal pulses.     Heart sounds: Normal heart sounds.  Pulmonary:     Effort: Pulmonary effort is normal. No respiratory distress.     Breath sounds: Normal breath sounds.  Musculoskeletal:        General: No edema.     Right shoulder: Tenderness (mild TTP biceps groove bilaterally) present. Normal range of motion. Normal strength.     Left shoulder: Tenderness present. Normal range of motion. Normal strength.     Right upper arm: Normal.     Left upper arm: Normal.     Right elbow: Normal range of motion. No tenderness.     Left elbow: Normal range of motion. No tenderness.     Right forearm: Normal.     Left forearm: Normal.     Right wrist: Normal.     Left wrist: Normal.     Right hand: Normal.     Left hand: Normal.     Cervical back: Normal and neck supple. Normal range of motion.     Thoracic back: Normal.     Lumbar back: Normal.     Comments: +tinels at left elbow. Negative tinel's at right elbow and wrists. Negative Phalen's  Skin:    General: Skin is warm and dry.  Neurological:     General: No focal deficit present.     Mental Status: He is alert and oriented to person, place, and time. Mental status is at baseline.     Cranial Nerves: No cranial nerve deficit.     Motor: No weakness.     Gait: Gait normal.  Psychiatric:        Mood and Affect: Mood and affect and mood normal.        Behavior: Behavior normal.        Thought Content: Thought content normal.      UC Treatments /  Results  Labs (all labs  ordered are listed, but only abnormal results are displayed) Labs Reviewed - No data to display  EKG   Radiology No results found.  Procedures Procedures (including critical care time)  Medications Ordered in UC Medications - No data to display  Initial Impression / Assessment and Plan / UC Course  I have reviewed the triage vital signs and the nursing notes.  Pertinent labs & imaging results that were available during my care of the patient were reviewed by me and considered in my medical decision making (see chart for details).   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 He has had this issue on and off for the past 2 and half years since "I was in prison." Patient denies ever having any injury in prison or at any point. He says that he slipped on poor mattresses then and thought that might have something to do with that but has since thought differently. He does have repetitive movements of his hands, wrists and elbows to his line of work.  Exam significant for tenderness that both bilateral biceps grooves and positive Tinel's at left elbow. Suspect likely cubital tunnel and ulnar neuropathy of these at the left elbow but possibly also on the right side does not appear to have any neck tenderness or problems with range of motion. I did advise him that sometimes there can be a problem with the neck that causes radiating symptoms to the upper extremities. Since this has been ongoing for so long I have advised him to follow-up with orthopedics and provided him with 2 different contacts for that. At this time I advised supportive care with Tylenol for pain relief and I have sent diclofenac sodium to pharmacy. Reviewed physical therapy exercises. ED precautions reviewed. Work note provided for today.   Final Clinical Impressions(s) / UC Diagnoses   Final diagnoses:  Bilateral arm pain  Ulnar neuropathy at elbow, left  Numbness and  tingling     Discharge Instructions     Your symptoms are most consistent with cubital tunnel syndrome or ulnar neuropathy.  See handout included.  Start the anti-inflammatory medicine I sent to the pharmacy.  You can also take Tylenol as needed for pain relief.  Try to avoid repetitive movements of the elbow.  You can ice the elbow and try to keep it elevated.  If no improvement in the next 2 weeks then he should follow-up with orthopedics for further work-up.  Or go to Ortho sooner if any symptoms worsen.    ED Prescriptions    Medication Sig Dispense Auth. Provider   diclofenac (VOLTAREN) 75 MG EC tablet Take 1 tablet (75 mg total) by mouth 2 (two) times daily. 60 tablet Shirlee Latch, PA-C     I have reviewed the PDMP during this encounter.   Shirlee Latch, PA-C 05/26/20 1418

## 2020-07-17 ENCOUNTER — Ambulatory Visit
Admission: EM | Admit: 2020-07-17 | Discharge: 2020-07-17 | Disposition: A | Discharge status: Home / Self Care | Payer: No Typology Code available for payment source | Attending: Emergency Medicine | Admitting: Emergency Medicine

## 2020-07-17 ENCOUNTER — Encounter: Payer: Self-pay | Admitting: Emergency Medicine

## 2020-07-17 ENCOUNTER — Other Ambulatory Visit: Payer: Self-pay

## 2020-07-17 DIAGNOSIS — R55 Syncope and collapse: Secondary | ICD-10-CM | POA: Insufficient documentation

## 2020-07-17 DIAGNOSIS — Z789 Other specified health status: Secondary | ICD-10-CM | POA: Insufficient documentation

## 2020-07-17 DIAGNOSIS — F1290 Cannabis use, unspecified, uncomplicated: Secondary | ICD-10-CM

## 2020-07-17 DIAGNOSIS — F1090 Alcohol use, unspecified, uncomplicated: Secondary | ICD-10-CM

## 2020-07-17 DIAGNOSIS — R112 Nausea with vomiting, unspecified: Secondary | ICD-10-CM | POA: Insufficient documentation

## 2020-07-17 DIAGNOSIS — R5383 Other fatigue: Secondary | ICD-10-CM | POA: Diagnosis not present

## 2020-07-17 LAB — CBC WITH DIFFERENTIAL/PLATELET
Abs Immature Granulocytes: 0.02 10*3/uL (ref 0.00–0.07)
Basophils Absolute: 0 10*3/uL (ref 0.0–0.1)
Basophils Relative: 1 %
Eosinophils Absolute: 0 10*3/uL (ref 0.0–0.5)
Eosinophils Relative: 0 %
HCT: 44 % (ref 39.0–52.0)
Hemoglobin: 15 g/dL (ref 13.0–17.0)
Immature Granulocytes: 0 %
Lymphocytes Relative: 25 %
Lymphs Abs: 2.2 10*3/uL (ref 0.7–4.0)
MCH: 31.6 pg (ref 26.0–34.0)
MCHC: 34.1 g/dL (ref 30.0–36.0)
MCV: 92.8 fL (ref 80.0–100.0)
Monocytes Absolute: 0.7 10*3/uL (ref 0.1–1.0)
Monocytes Relative: 8 %
Neutro Abs: 5.8 10*3/uL (ref 1.7–7.7)
Neutrophils Relative %: 66 %
Platelets: 242 10*3/uL (ref 150–400)
RBC: 4.74 MIL/uL (ref 4.22–5.81)
RDW: 12 % (ref 11.5–15.5)
WBC: 8.7 10*3/uL (ref 4.0–10.5)
nRBC: 0 % (ref 0.0–0.2)

## 2020-07-17 LAB — COMPREHENSIVE METABOLIC PANEL
ALT: 71 U/L — ABNORMAL HIGH (ref 0–44)
AST: 48 U/L — ABNORMAL HIGH (ref 15–41)
Albumin: 4.7 g/dL (ref 3.5–5.0)
Alkaline Phosphatase: 47 U/L (ref 38–126)
Anion gap: 7 (ref 5–15)
BUN: 21 mg/dL — ABNORMAL HIGH (ref 6–20)
CO2: 29 mmol/L (ref 22–32)
Calcium: 9.2 mg/dL (ref 8.9–10.3)
Chloride: 103 mmol/L (ref 98–111)
Creatinine, Ser: 0.94 mg/dL (ref 0.61–1.24)
GFR, Estimated: >60 mL/min (ref >60)
Glucose, Bld: 121 mg/dL — ABNORMAL HIGH (ref 70–99)
Potassium: 3.9 mmol/L (ref 3.5–5.1)
Sodium: 139 mmol/L (ref 135–145)
Total Bilirubin: 0.7 mg/dL (ref 0.3–1.2)
Total Protein: 7.8 g/dL (ref 6.5–8.1)

## 2020-07-17 MED ORDER — ONDANSETRON 4 MG PO TBDP: 4.0000 mg | ORAL_TABLET | Freq: Three times a day (TID) | ORAL | 0 refills | Status: AC | PRN

## 2020-07-17 NOTE — ED Triage Notes (Signed)
Pt is present today with vomiting and fatigue. Pt states that he started vomiting Thursday. Pt states that he also felt dizzy and confused to the point where he had to stop his car and call for family to pick him up. Pt states that he also has a loss of appetie

## 2020-07-17 NOTE — ED Provider Notes (Signed)
[facility]    CSN: [id number] Arrival date & time: 07/17/20  1431      History   Chief Complaint Chief Complaint  Patient presents with  . Fatigue  . Emesis  . Diarrhea    HPI Thomas Cohen is a 45 y.o. male.   Thomas Cohen presents with complaints of recurrent emesis as well as episode yesterday of feeling "out of it".  He has had emesis intermittently for months now, often when he first wakes up in the morning. Occasionally has blood in his stool. Was referred for a colonoscopy in the past but had to cancel. Has loose stool intermittently as well. States for the past year has been drinking around 8-10 beers a night. He also has been using marijuana daily for the past few months. Yesterday morning he vomited x2 and then drove to work. He did not eat anything. He had to return home, en route he began feeling "out of it" and like vision was leaving, and had to pull off the road and have his girlfriend come and get him. He states he then proceeded to sleep 12 hours and is just now feeling improved in order to be seen. Now taking liquids. Normal vision. No dizziness. No longer feels "out of it."  No current nausea. Denies any chest pain , shortness of breath , or previous episodes similar. Doesn't have a pcp.    ROS per HPI, negative if not otherwise mentioned.      History reviewed. No pertinent past medical history.  There are no problems to display for this patient.   Past Surgical History:  Procedure Laterality Date  . HERNIA REPAIR         Home Medications    Prior to Admission medications   Medication Sig Start Date End Date Taking? Authorizing Provider  ondansetron (ZOFRAN-ODT) 4 MG disintegrating tablet Take 1 tablet (4 mg total) by mouth every 8 (eight) hours as needed for nausea or vomiting. 07/17/20  Yes Laterra Lubinski, Barron Alvine, NP  albuterol (VENTOLIN HFA) 108 (90 Base) MCG/ACT inhaler Inhale 2 puffs into the lungs every 4 (four) hours as needed  for wheezing or shortness of breath. For 7 days, then prn cough and wheezing 12/26/19   Rodriguez-Southworth, Nettie Elm, PA-C  clarithromycin (BIAXIN) 500 MG tablet Take 1 tablet (500 mg total) by mouth 2 (two) times daily. 12/26/19   Rodriguez-Southworth, Nettie Elm, PA-C  meloxicam (MOBIC) 15 MG tablet Take 1 tablet (15 mg total) by mouth daily as needed for pain. 10/14/19   Tommie Sams, DO    Family History Family History  Problem Relation Age of Onset  . Healthy Mother   . Healthy Father   . Cancer Maternal Uncle        colon    Social History Social History   Tobacco Use  . Smoking status: Never Smoker  . Smokeless tobacco: Current User    Types: Snuff  Vaping Use  . Vaping Use: Never used  Substance Use Topics  . Alcohol use: Yes  . Drug use: Not Currently     Allergies   Sulfamethoxazole-trimethoprim   Review of Systems Review of Systems   Physical Exam Triage Vital Signs ED Triage Vitals  Enc Vitals Group     BP 07/17/20 1521 (!) 129/91     Pulse Rate 07/17/20 1521 62     Resp 07/17/20 1521 18     Temp 07/17/20 1521 98.7 F (37.1 C)     Temp Source 07/17/20  1521 Oral     SpO2 07/17/20 1521 100 %     Weight --      Height --      Head Circumference --      Peak Flow --      Pain Score 07/17/20 1519 0     Pain Loc --      Pain Edu? --      Excl. in GC? --    No data found.  Updated Vital Signs BP (!) 129/91 (BP Location: Left Arm)   Pulse 62   Temp 98.7 F (37.1 C) (Oral)   Resp 18   SpO2 100%   Visual Acuity Right Eye Distance:   Left Eye Distance:   Bilateral Distance:    Right Eye Near:   Left Eye Near:    Bilateral Near:     Physical Exam Constitutional:      Appearance: He is well-developed.  Cardiovascular:     Rate and Rhythm: Normal rate.  Pulmonary:     Effort: Pulmonary effort is normal.  Abdominal:     Tenderness: There is no abdominal tenderness. There is no right CVA tenderness or left CVA tenderness.  Skin:    General:  Skin is warm and dry.  Neurological:     Mental Status: He is alert and oriented to person, place, and time.      UC Treatments / Results  Labs (all labs ordered are listed, but only abnormal results are displayed) Labs Reviewed  COMPREHENSIVE METABOLIC PANEL - Abnormal; Notable for the following components:      Result Value   Glucose, Bld 121 (*)    BUN 21 (*)    AST 48 (*)    ALT 71 (*)    All other components within normal limits  CBC WITH DIFFERENTIAL/PLATELET    EKG   Radiology No results found.  Procedures Procedures (including critical care time)  Medications Ordered in UC Medications - No data to display  Initial Impression / Assessment and Plan / UC Course  I have reviewed the triage vital signs and the nursing notes.  Pertinent labs & imaging results that were available during my care of the patient were reviewed by me and considered in my medical decision making (see chart for details).     Heavy and regular alcohol and marijuana use, with episode of near syncope yesterday after vomiting. I question if low blood sugar led to episode yesterday, after vomiting? Alcohol and marijuana over use likely contributing to nausea and vomiting which is intermittent and recurrent. No acute abdominal pain. LFTs are actually improved here today. Patient is drinking a vitamin water during exam, and blood sugar is 121- non fasting. Supportive cares recommended. Encouraged establish with a PCP for long term management and if needs further referral or evaluation. Encouraged decrease in ETOH and marijuana as able. Patient verbalized understanding and agreeable to plan.   Final Clinical Impressions(s) / UC Diagnoses   Final diagnoses:  Fatigue, unspecified type  Non-intractable vomiting with nausea, unspecified vomiting type  Near syncope  Heavy alcohol consumption  Marijuana user     Discharge Instructions     I will call you if there are any concerns with your  laboratory results.  I do wonder if your episode yesterday was related to low blood sugar related to vomiting and having not eaten anything.  Increase your hydration with water and gatorade over the next 24 hours and eat as tolerated.  Zofran every 8 hours  as needed for nausea or vomiting.   I do question if your alcohol or marijuana use are contributing to your symptoms and would recommend cutting back to see if this helps.  Please follow up with gastroenterology, I would start with establishing with a primary care provider.  Please return for any further episodes or any worsening of symptoms.    ED Prescriptions    Medication Sig Dispense Auth. Provider   ondansetron (ZOFRAN-ODT) 4 MG disintegrating tablet Take 1 tablet (4 mg total) by mouth every 8 (eight) hours as needed for nausea or vomiting. 12 tablet Georgetta Haber, NP     PDMP not reviewed this encounter.   Georgetta Haber, NP 07/17/20 1836

## 2020-07-17 NOTE — Discharge Instructions (Signed)
I will call you if there are any concerns with your laboratory results.  I do wonder if your episode yesterday was related to low blood sugar related to vomiting and having not eaten anything.  Increase your hydration with water and gatorade over the next 24 hours and eat as tolerated.  Zofran every 8 hours as needed for nausea or vomiting.   I do question if your alcohol or marijuana use are contributing to your symptoms and would recommend cutting back to see if this helps.  Please follow up with gastroenterology, I would start with establishing with a primary care provider.  Please return for any further episodes or any worsening of symptoms.

## 2020-07-27 ENCOUNTER — Telehealth (HOSPITAL_COMMUNITY): Payer: Self-pay | Admitting: Licensed Clinical Social Worker

## 2021-04-28 DEATH — deceased

## 2021-10-07 IMAGING — CR DG CHEST 2V
2 series · 2 of 2 positions shown · non-contrast
Comparison: None.

CLINICAL DATA: Persistent cough, history of [id number] positivity 2
weeks ago, initial encounter

EXAM:
CHEST - 2 VIEW

[chest pa]
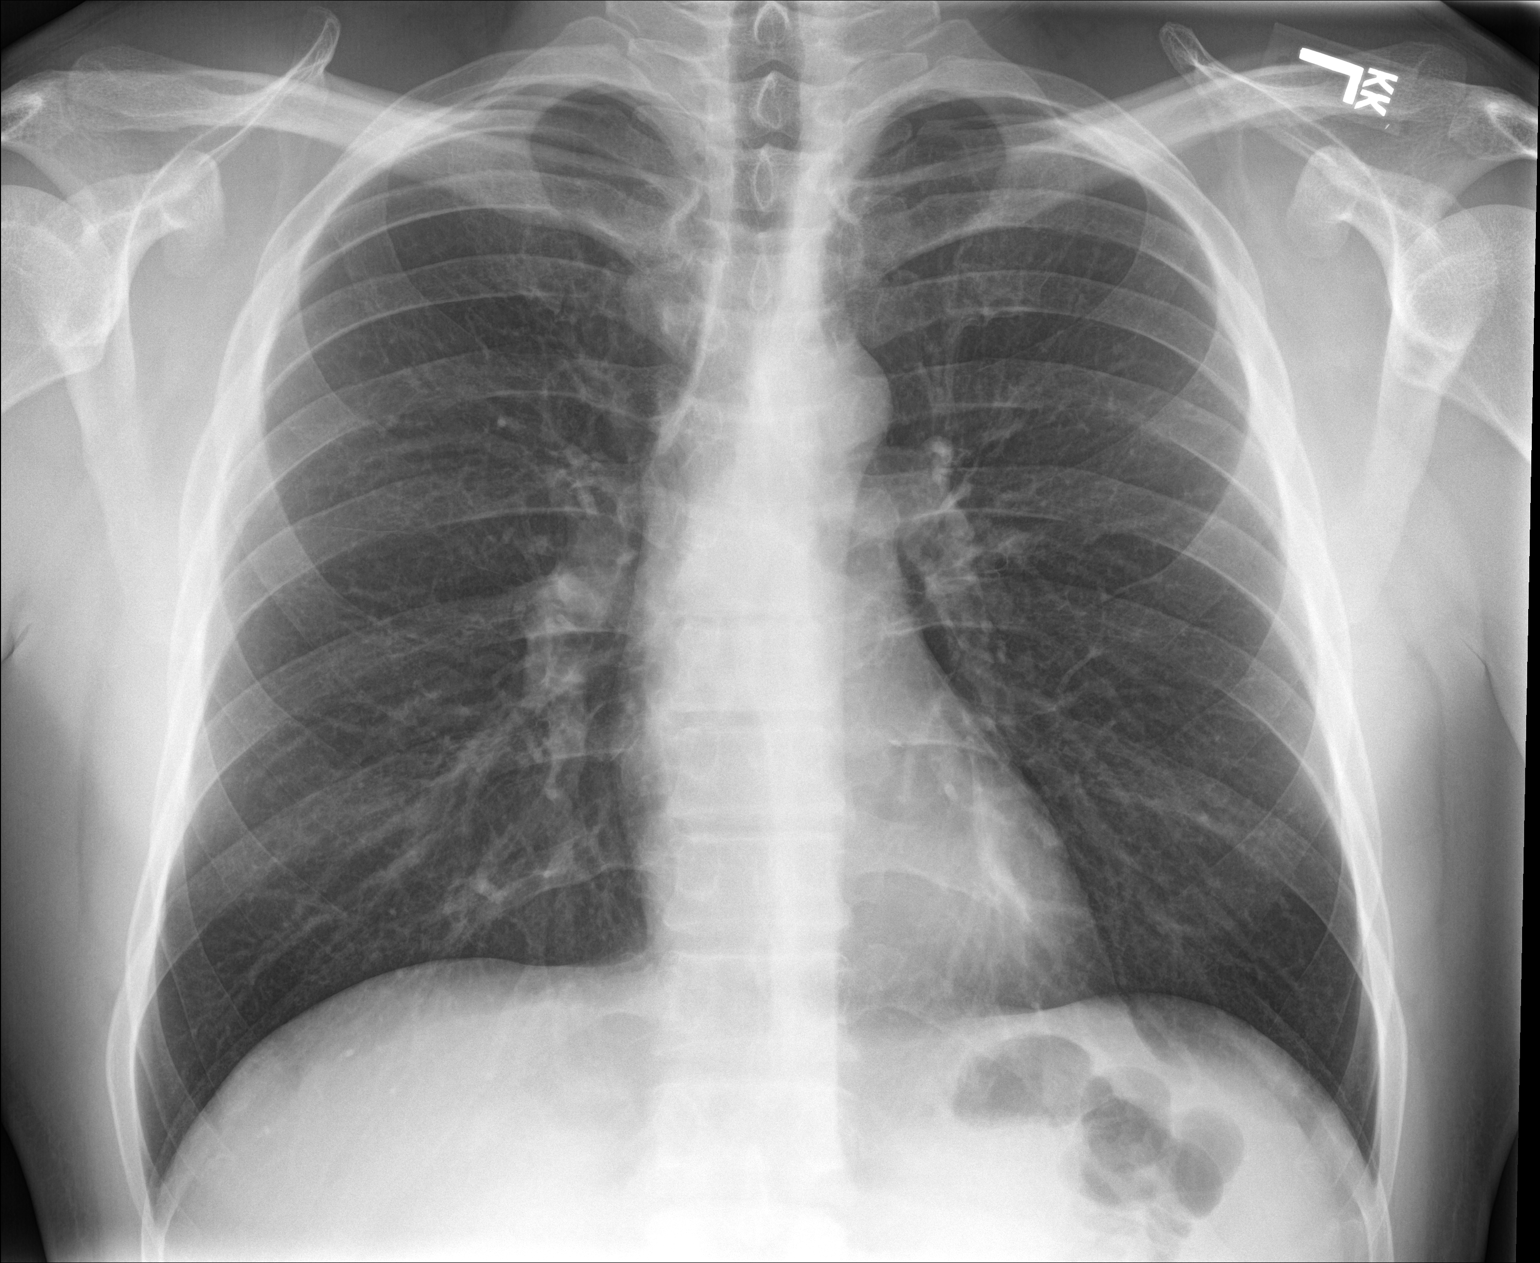

[chest lat]
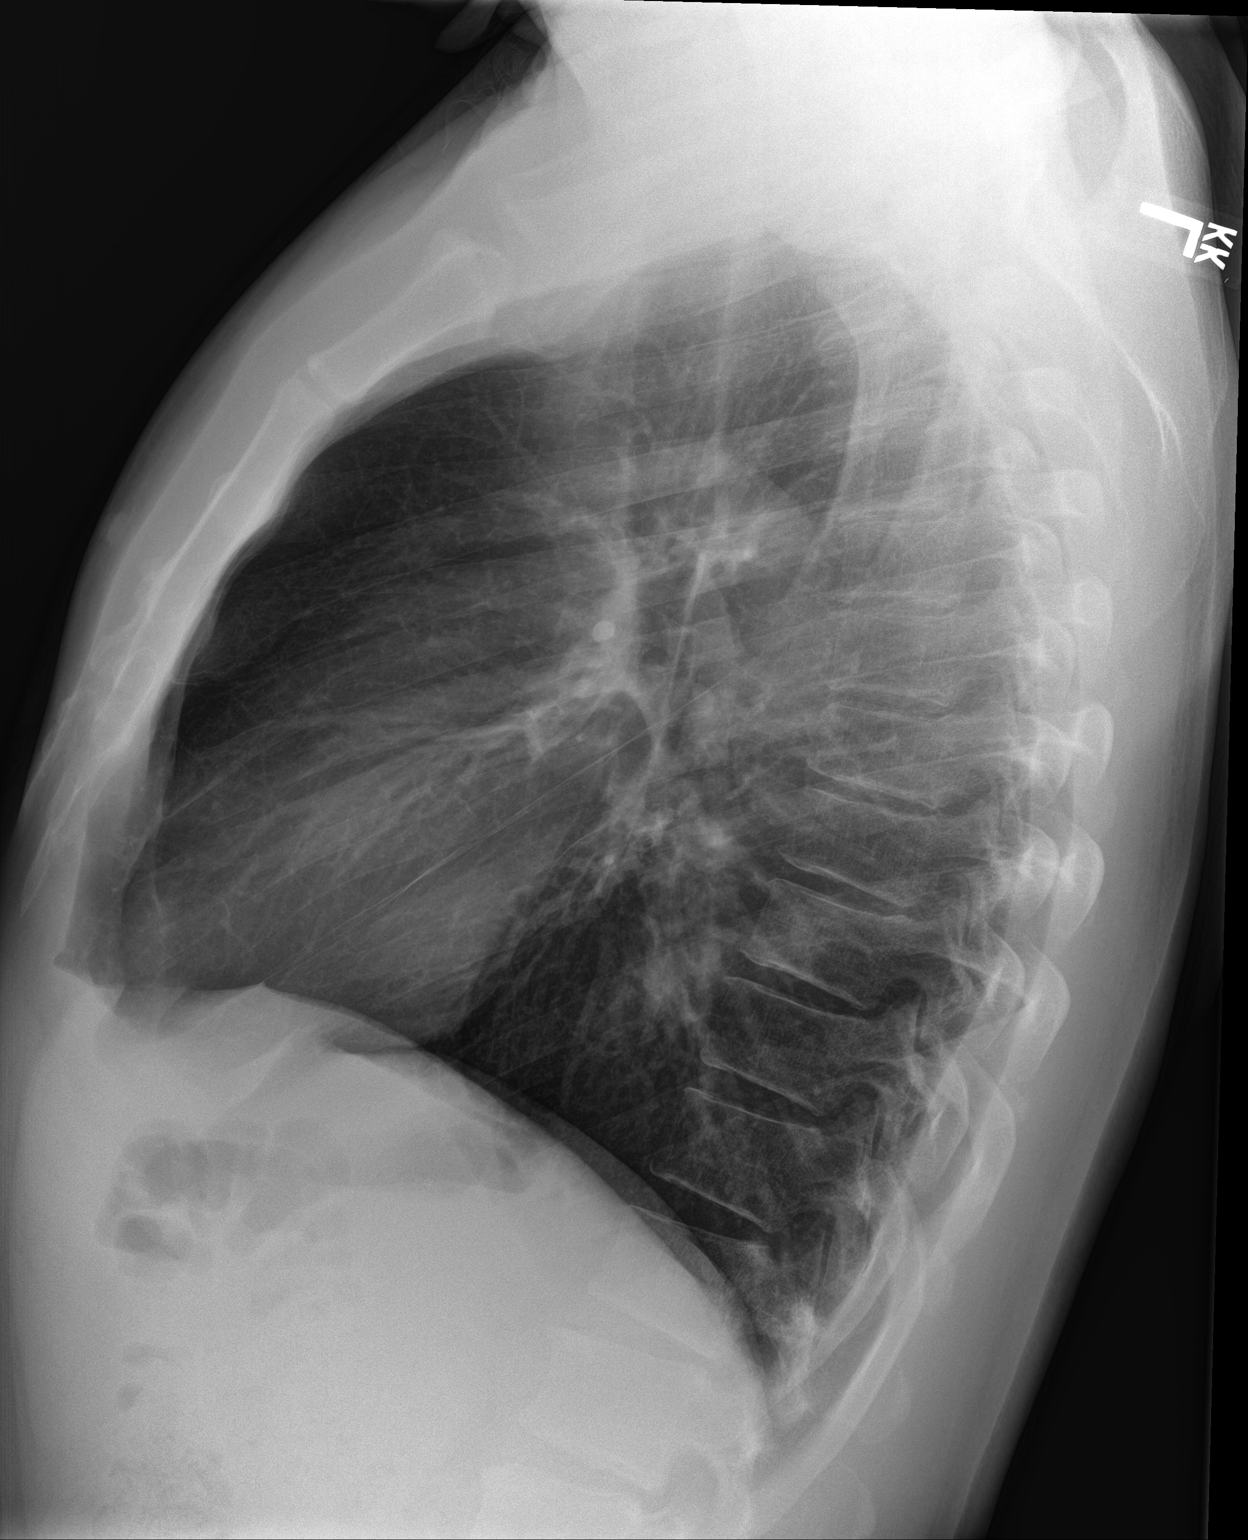

[2 of 2 positions shown; findings below may reference images not displayed]

FINDINGS: Cardiac shadow is within normal limits. The lungs are clear
bilaterally. No focal infiltrate or effusion is seen. No bony
abnormality is noted.
IMPRESSION: No acute abnormality noted.
